# Patient Record
Sex: Female | Born: 1970 | Race: White | Hispanic: No | Smoking: Never smoker
Health system: Southern US, Community
[De-identification: ages and names within clinical notes are randomized; demographics above are authoritative.]

## PROBLEM LIST (undated history)

## (undated) DIAGNOSIS — K219 Gastro-esophageal reflux disease without esophagitis: Secondary | ICD-10-CM

## (undated) DIAGNOSIS — F329 Major depressive disorder, single episode, unspecified: Secondary | ICD-10-CM

## (undated) DIAGNOSIS — F419 Anxiety disorder, unspecified: Secondary | ICD-10-CM

## (undated) DIAGNOSIS — F32A Depression, unspecified: Secondary | ICD-10-CM

---

## 2000-02-17 ENCOUNTER — Other Ambulatory Visit: Admission: RE | Admit: 2000-02-17 | Discharge: 2000-02-17 | Payer: Self-pay | Admitting: *Deleted

## 2000-10-30 ENCOUNTER — Emergency Department (HOSPITAL_COMMUNITY): Admission: EM | Admit: 2000-10-30 | Discharge: 2000-10-30 | Payer: Self-pay | Admitting: Emergency Medicine

## 2001-05-12 ENCOUNTER — Emergency Department (HOSPITAL_COMMUNITY): Admission: EM | Admit: 2001-05-12 | Discharge: 2001-05-12 | Payer: Self-pay | Admitting: Emergency Medicine

## 2005-10-06 ENCOUNTER — Emergency Department (HOSPITAL_COMMUNITY): Admission: EM | Admit: 2005-10-06 | Discharge: 2005-10-06 | Payer: Self-pay | Admitting: Emergency Medicine
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
## 2012-08-05 ENCOUNTER — Ambulatory Visit: Payer: Managed Care, Other (non HMO) | Admitting: Physician Assistant

## 2012-08-05 VITALS — BP 119/72 | HR 107 | Temp 98.7°F | Resp 17 | Ht 60.5 in | Wt 194.2 lb

## 2012-08-05 DIAGNOSIS — H9209 Otalgia, unspecified ear: Secondary | ICD-10-CM

## 2012-08-05 DIAGNOSIS — H6120 Impacted cerumen, unspecified ear: Secondary | ICD-10-CM

## 2012-08-05 NOTE — Progress Notes (Signed)
Patient ID: Amanda Briggs MRN: [id number], DOB: [birth year], 41 y.o. Date of Encounter: 08/05/2012, 6:22 PM  Primary Physician: No primary provider on file.  Chief Complaint: Cerumen impaction  HPI: 40 y.o. year old female with history below presents with right ear cerumen impaction. Symptoms began three weeks prior. History of increased wax production requiring the ears to be flushed out several times previously. Uses Q-tips 1-2 times per month. Mild otalgia and tinnitus. No vertigo symptoms. Has gone through 6 ear wax candles. Notes some burnt orange drainage, discharge after using her candles. No bleeding from the ears. Has had some allergy symptoms. Otherwise doing well without issues today.    History reviewed. No pertinent past medical history.   Home Meds: Prior to Admission medications   Medication Sig Start Date End Date Taking? Authorizing Provider  Ascorbic Acid (VITAMIN C) 100 MG tablet Take 100 mg by mouth daily.   Yes Historical Provider, MD  cholecalciferol (VITAMIN D) 1000 UNITS tablet Take 1,000 Units by mouth daily.   Yes Historical Provider, MD  Lecithin 400 MG CAPS Take by mouth.   Yes Historical Provider, MD  milk thistle 175 MG tablet Take 175 mg by mouth daily.   Yes Historical Provider, MD  Multiple Vitamins-Minerals (MULTIVITAMIN WITH MINERALS) tablet Take 1 tablet by mouth daily.   Yes Historical Provider, MD  Specialty Vitamins Products (MAGNESIUM, AMINO ACID CHELATE,) 133 MG tablet Take 1 tablet by mouth 2 (two) times daily.   Yes Historical Provider, MD  thiamine (VITAMIN B-1) 100 MG tablet Take 100 mg by mouth daily.   Yes Historical Provider, MD    Allergies:  Allergies  Allergen Reactions  . Latex Other (See Comments)    She states her skin peels off   . Penicillins Other (See Comments)    Patient was told she was allergic when she was a kid     History   Social History  . Marital Status: Married    Spouse Name: N/A    Number of Children: N/A  .  Years of Education: N/A   Occupational History  . Not on file.   Social History Main Topics  . Smoking status: Never Smoker   . Smokeless tobacco: Not on file  . Alcohol Use: No  . Drug Use: No  . Sexually Active: Yes    Birth Control/ Protection: None   Other Topics Concern  . Not on file   Social History Narrative  . No narrative on file     Review of Systems: Constitutional: negative for chills, fever, night sweats, weight changes, or fatigue  HEENT: negative for vision changes, ST, epistaxis, or sinus pressure Cardiovascular: negative for chest pain or palpitations Respiratory: negative for hemoptysis, wheezing, shortness of breath, or cough Abdominal: negative for abdominal pain, nausea, or vomiting Dermatological: negative for rash Neurologic: negative for headache, dizziness, or syncope   Physical Exam: Blood pressure 119/72, pulse 107, temperature 98.7 F (37.1 C), temperature source Oral, resp. rate 17, height 5' 0.5" (1.537 m), weight 194 lb 3.2 oz (88.089 kg), last menstrual period 08/04/2012, SpO2 96.00%., Body mass index is 37.30 kg/(m^2). General: Well developed, well nourished, in no acute distress. Head: Normocephalic, atraumatic, eyes without discharge, sclera non-icteric, nares are without discharge. Right auditory canal with cerumen impaction. S/P ear lavage right auditory canal clear. Bilateral TM's are without perforation, pearly grey and translucent with reflective cone of light bilaterally. No mastoid TTP. Oral cavity moist, posterior pharynx without exudate, erythema, peritonsillar abscess, or post  nasal drip.  Neck: Supple. No thyromegaly. Full ROM. No lymphadenopathy. Lungs: Clear bilaterally to auscultation without wheezes, rales, or rhonchi. Breathing is unlabored. Heart: RRR with S1 S2. No murmurs, rubs, or gallops appreciated. Msk:  Strength and tone normal for age. Extremities/Skin: Warm and dry. No clubbing or cyanosis. No edema. No rashes or  suspicious lesions. Neuro: Alert and oriented X 3. Moves all extremities spontaneously. Gait is normal. CNII-XII grossly in tact. Psych:  Responds to questions appropriately with a normal affect.   Ear lavage performed by: Maurene Capes   ASSESSMENT AND PLAN:  41 y.o. year old female with cerumen impaction s/p ear lavage. -Resolved -Ear lavage per above -RTC prn  Signed, Eula Listen, PA-C 08/05/2012 6:22 PM

## 2012-11-18 ENCOUNTER — Ambulatory Visit: Payer: Managed Care, Other (non HMO) | Admitting: Physician Assistant

## 2012-11-18 VITALS — BP 122/64 | HR 90 | Temp 98.4°F | Resp 18 | Ht 61.0 in | Wt 183.0 lb

## 2012-11-18 DIAGNOSIS — R059 Cough, unspecified: Secondary | ICD-10-CM

## 2012-11-18 DIAGNOSIS — J209 Acute bronchitis, unspecified: Secondary | ICD-10-CM

## 2012-11-18 DIAGNOSIS — L282 Other prurigo: Secondary | ICD-10-CM

## 2012-11-18 MED ORDER — PREDNISONE 20 MG PO TABS: ORAL_TABLET | ORAL | Status: DC

## 2012-11-18 MED ORDER — HYDROCOD POLST-CHLORPHEN POLST 10-8 MG/5ML PO LQCR: 5.0000 mL | Freq: Two times a day (BID) | ORAL | Status: DC | PRN

## 2012-11-18 MED ORDER — AZITHROMYCIN 250 MG PO TABS: ORAL_TABLET | ORAL | Status: DC

## 2012-11-18 NOTE — Progress Notes (Signed)
  Subjective:    Patient ID: Amanda Briggs, female    DOB: 1971/06/14, 42 y.o.   MRN: [id number]  HPI 42 year old female presents with 2 problems:  #1: Prurtic rash x 1 week. Admits to diffuse, erythematous rash over arms and legs with itching on trunk. States symptoms have progressively worsened.  She does admit that she has started a new OTC supplement about 10 days ago.  Denies any new foods, lotions, soaps, or detergents.  Does live with her father and he is asymptomatic.  She has been taking Benadryl at bedtime which helps by "knocking her out."    #2: Sinus congestion, productive cough, and nasal drainage x 1 week. States cough has worsened and is her most problematic symptom.  She has been taking Delsym which has not been helping much.  Denies fever, chills, nausea, vomiting, headache, otalgia, or sore throat.  No SOB or wheezing.  She overall "feels great" after about a 72 lb weight loss.  Continues to exercise and has been eating healthy.      Review of Systems  Constitutional: Negative for fever and chills.  HENT: Positive for congestion, rhinorrhea and postnasal drip. Negative for ear pain, sore throat and neck pain.   Respiratory: Positive for cough. Negative for chest tightness, shortness of breath and wheezing.   Cardiovascular: Negative for chest pain.  Gastrointestinal: Negative for nausea, vomiting and abdominal pain.  Neurological: Negative for dizziness and headaches.       Objective:   Physical Exam  Constitutional: She is oriented to person, place, and time. She appears well-developed and well-nourished.  HENT:  Head: Normocephalic and atraumatic.  Right Ear: Hearing, tympanic membrane, external ear and ear canal normal.  Left Ear: Hearing, tympanic membrane, external ear and ear canal normal.  Mouth/Throat: Uvula is midline, oropharynx is clear and moist and mucous membranes are normal. No oropharyngeal exudate (clear postnasal drainage).  Eyes: Conjunctivae are  normal.  Neck: Normal range of motion.  Cardiovascular: Normal rate, regular rhythm and normal heart sounds.   Pulmonary/Chest: Effort normal and breath sounds normal.  Lymphadenopathy:    She has no cervical adenopathy.  Neurological: She is alert and oriented to person, place, and time.  Skin:  Diffuse erythematous rash with pinpoint scabbed areas. Excoriations from scratching. No evidence of secondary infection. Rash spares palms, soles, and interdigital webbed spaces.    Psychiatric: She has a normal mood and affect. Her behavior is normal. Judgment and thought content normal.          Assessment & Plan:  1. Cough - Plan: chlorpheniramine-HYDROcodone (TUSSIONEX PENNKINETIC ER) 10-8 MG/5ML LQCR  -Zpack as directed.   -Tussionex qhs prn cough  -Increase fluids and rest 2. Pruritic rash - Plan: predniSONE (DELTASONE) 20 MG tablet  -Likely allergic reaction to iodine supplements  -D/C supplements  -Prednisone taper  -Zyrtec 10 mg in the morning 3. Acute bronchitis - Plan: azithromycin (ZITHROMAX) 250 MG tablet  -See above.   -Follow up if symptoms worsen or fail to improve.

## 2012-11-18 NOTE — Patient Instructions (Signed)
Recommend Zyrtec 10 mg daily in the morning Tussionex at bedtime to help with cough  Prednisone in the morning

## 2012-12-01 ENCOUNTER — Telehealth: Payer: Self-pay

## 2012-12-01 NOTE — Telephone Encounter (Signed)
[facility] called to inquire about a refill on prednisone. Advised that chart is being reviewed.

## 2012-12-01 NOTE — Telephone Encounter (Signed)
Patient states rash has gotten worse on legs she took the prednisone she is advised to take zyrtec daily until we call her back

## 2012-12-01 NOTE — Telephone Encounter (Signed)
-  Likely allergic reaction to iodine supplements  -D/C supplements  -Prednisone taper  -Zyrtec 10 mg in the morning Need to know what she is currently doing for the rash

## 2012-12-01 NOTE — Telephone Encounter (Signed)
Patient would like referral to dermatologist or allergist for skin condition she saw Herbert Seta for on [id number] because she is still having problems.

## 2012-12-01 NOTE — Telephone Encounter (Signed)
I do not think it is appropriate to refill prednisone.  I have a placed a referral to dermatology.

## 2012-12-02 NOTE — Telephone Encounter (Signed)
Patient advised of referral and not to repeat prednisone. She will continue Zyrtec and take Benadryl at night.

## 2013-06-04 ENCOUNTER — Ambulatory Visit: Payer: Managed Care, Other (non HMO) | Admitting: Internal Medicine

## 2013-06-04 ENCOUNTER — Encounter: Payer: Self-pay | Admitting: Internal Medicine

## 2013-06-04 VITALS — BP 124/82 | HR 80 | Temp 99.0°F | Resp 16 | Ht 61.5 in | Wt 152.8 lb

## 2013-06-04 DIAGNOSIS — N390 Urinary tract infection, site not specified: Secondary | ICD-10-CM

## 2013-06-04 DIAGNOSIS — R350 Frequency of micturition: Secondary | ICD-10-CM

## 2013-06-04 LAB — POCT URINALYSIS DIPSTICK
Bilirubin, UA: NEGATIVE
Blood, UA: NEGATIVE
Nitrite, UA: POSITIVE
Protein, UA: NEGATIVE

## 2013-06-04 LAB — POCT UA - MICROSCOPIC ONLY: Crystals, Ur, HPF, POC: NEGATIVE

## 2013-06-04 MED ORDER — CIPROFLOXACIN HCL 500 MG PO TABS: 500.0000 mg | ORAL_TABLET | Freq: Two times a day (BID) | ORAL | Status: DC

## 2013-06-04 NOTE — Patient Instructions (Signed)
Urinary Tract Infection  Urinary tract infections (UTIs) can develop anywhere along your urinary tract. Your urinary tract is your body's drainage system for removing wastes and extra water. Your urinary tract includes two kidneys, two ureters, a bladder, and a urethra. Your kidneys are a pair of bean-shaped organs. Each kidney is about the size of your fist. They are located below your ribs, one on each side of your spine.  CAUSES  Infections are caused by microbes, which are microscopic organisms, including fungi, viruses, and bacteria. These organisms are so small that they can only be seen through a microscope. Bacteria are the microbes that most commonly cause UTIs.  SYMPTOMS   Symptoms of UTIs may vary by age and gender of the patient and by the location of the infection. Symptoms in young women typically include a frequent and intense urge to urinate and a painful, burning feeling in the bladder or urethra during urination. Older women and men are more likely to be tired, shaky, and weak and have muscle aches and abdominal pain. A fever may mean the infection is in your kidneys. Other symptoms of a kidney infection include pain in your back or sides below the ribs, nausea, and vomiting.  DIAGNOSIS  To diagnose a UTI, your caregiver will ask you about your symptoms. Your caregiver also will ask to provide a urine sample. The urine sample will be tested for bacteria and white blood cells. White blood cells are made by your body to help fight infection.  TREATMENT   Typically, UTIs can be treated with medication. Because most UTIs are caused by a bacterial infection, they usually can be treated with the use of antibiotics. The choice of antibiotic and length of treatment depend on your symptoms and the type of bacteria causing your infection.  HOME CARE INSTRUCTIONS   If you were prescribed antibiotics, take them exactly as your caregiver instructs you. Finish the medication even if you feel better after you  have only taken some of the medication.   Drink enough water and fluids to keep your urine clear or pale yellow.   Avoid caffeine, tea, and carbonated beverages. They tend to irritate your bladder.   Empty your bladder often. Avoid holding urine for long periods of time.   Empty your bladder before and after sexual intercourse.   After a bowel movement, women should cleanse from front to back. Use each tissue only once.  SEEK MEDICAL CARE IF:    You have back pain.   You develop a fever.   Your symptoms do not begin to resolve within 3 days.  SEEK IMMEDIATE MEDICAL CARE IF:    You have severe back pain or lower abdominal pain.   You develop chills.   You have nausea or vomiting.   You have continued burning or discomfort with urination.  MAKE SURE YOU:    Understand these instructions.   Will watch your condition.   Will get help right away if you are not doing well or get worse.  Document Released: 06/25/2005 Document Revised: 03/16/2012 Document Reviewed: 10/24/2011  [employer] Patient Information 2014 [employer].

## 2013-06-04 NOTE — Progress Notes (Signed)
  Subjective:    Patient ID: Amanda Briggs, female    DOB: [birth year], 42 y.o.   MRN: [id number]  HPI Has 3d of frequency, urgency and hard to finish. No fever , chills or back pain   Review of Systems     Objective:   Physical Exam Normal, no tenderness       Assessment & Plan:  UTI/Cipro

## 2013-06-06 ENCOUNTER — Telehealth: Payer: Self-pay

## 2013-06-06 ENCOUNTER — Other Ambulatory Visit (INDEPENDENT_AMBULATORY_CARE_PROVIDER_SITE_OTHER): Payer: Managed Care, Other (non HMO) | Admitting: *Deleted

## 2013-06-06 DIAGNOSIS — N390 Urinary tract infection, site not specified: Secondary | ICD-10-CM

## 2013-06-06 NOTE — Progress Notes (Signed)
Here for labs only

## 2013-06-06 NOTE — Telephone Encounter (Signed)
Pt states she was seen over the weekend for uti symptoms and is concerned about something during her ov.   States she told triage she also wanted to be tested for STD's and provided them with urine sample divided over two cups.   States she and the dr never spoke about the std testing because she assumed the triage person notified him. States no one took any blood from her.   Pt is concerned no std testing was performed as requested and would like to know so she can rtc and provide blood if necessary.   Would like to come today after work if she can.   Please call pt: [phone] -- can leave detailed message and pt will return call after work.  bf

## 2013-06-06 NOTE — Telephone Encounter (Signed)
Put in order called patient.

## 2013-06-06 NOTE — Telephone Encounter (Signed)
She can come back in for labwork but should make sure that in the future she tells the provider as well. It is routine for [place] to collect 2 cups but that does not mean we automatically test for STD's.  She can come in and leave another dirty catch to send for GC/CL and have blood drawn for HIV and RPR.

## 2013-06-07 LAB — RPR

## 2013-06-07 LAB — HIV ANTIBODY (ROUTINE TESTING W REFLEX): HIV: NONREACTIVE

## 2013-06-08 LAB — GC/CHLAMYDIA PROBE AMP
CT Probe RNA: NEGATIVE
GC Probe RNA: NEGATIVE

## 2014-12-13 NOTE — H&P (Signed)
  Patient name Amanda Briggs, Amanda Briggs DICTATION# [id number] CSN# [id number]  Juluis MireMCCOMB,Amanda Reierson S, MD 12/13/2014 1:19 PM

## 2014-12-14 ENCOUNTER — Encounter (HOSPITAL_COMMUNITY): Payer: Self-pay | Admitting: *Deleted

## 2014-12-14 MED ORDER — CLINDAMYCIN PHOSPHATE 900 MG/50ML IV SOLN
900.0000 mg | INTRAVENOUS | Status: AC
  Administered 2014-12-15: 900 mg via INTRAVENOUS
  Filled 2014-12-14: qty 50

## 2014-12-14 MED ORDER — CIPROFLOXACIN IN D5W 400 MG/200ML IV SOLN
400.0000 mg | INTRAVENOUS | Status: AC
  Administered 2014-12-15: 400 mg via INTRAVENOUS
  Filled 2014-12-14: qty 200

## 2014-12-15 ENCOUNTER — Ambulatory Visit (HOSPITAL_COMMUNITY)
Admission: RE | Admit: 2014-12-15 | Discharge: 2014-12-15 | Disposition: A | Discharge status: Home / Self Care | Payer: Managed Care, Other (non HMO) | Source: Ambulatory Visit | Attending: Obstetrics and Gynecology | Admitting: Obstetrics and Gynecology

## 2014-12-15 ENCOUNTER — Encounter (HOSPITAL_COMMUNITY)
Admission: RE | Disposition: A | Discharge status: Home / Self Care | Payer: Self-pay | Source: Ambulatory Visit | Attending: Obstetrics and Gynecology

## 2014-12-15 ENCOUNTER — Ambulatory Visit (HOSPITAL_COMMUNITY): Payer: Managed Care, Other (non HMO) | Admitting: Anesthesiology

## 2014-12-15 ENCOUNTER — Encounter (HOSPITAL_COMMUNITY): Payer: Self-pay | Admitting: *Deleted

## 2014-12-15 DIAGNOSIS — K219 Gastro-esophageal reflux disease without esophagitis: Secondary | ICD-10-CM | POA: Insufficient documentation

## 2014-12-15 DIAGNOSIS — F419 Anxiety disorder, unspecified: Secondary | ICD-10-CM | POA: Diagnosis not present

## 2014-12-15 DIAGNOSIS — O034 Incomplete spontaneous abortion without complication: Secondary | ICD-10-CM | POA: Diagnosis present

## 2014-12-15 DIAGNOSIS — F329 Major depressive disorder, single episode, unspecified: Secondary | ICD-10-CM | POA: Diagnosis not present

## 2014-12-15 HISTORY — DX: Gastro-esophageal reflux disease without esophagitis: K21.9

## 2014-12-15 HISTORY — DX: Depression, unspecified: F32.A

## 2014-12-15 HISTORY — DX: Major depressive disorder, single episode, unspecified: F32.9

## 2014-12-15 HISTORY — DX: Anxiety disorder, unspecified: F41.9

## 2014-12-15 HISTORY — PX: DILATION AND EVACUATION: SHX1459

## 2014-12-15 LAB — CBC
HEMATOCRIT: 40.2 % (ref 36.0–46.0)
Hemoglobin: 13 g/dL (ref 12.0–15.0)
MCH: 26.1 pg (ref 26.0–34.0)
MCHC: 32.3 g/dL (ref 30.0–36.0)
MCV: 80.7 fL (ref 78.0–100.0)
Platelets: 238 10*3/uL (ref 150–400)
RBC: 4.98 MIL/uL (ref 3.87–5.11)
RDW: 16.2 % — ABNORMAL HIGH (ref 11.5–15.5)
WBC: 10.6 10*3/uL — ABNORMAL HIGH (ref 4.0–10.5)

## 2014-12-15 LAB — ABO/RH: ABO/RH(D): O POS

## 2014-12-15 SURGERY — DILATION AND EVACUATION, UTERUS
Anesthesia: Monitor Anesthesia Care | Site: Uterus

## 2014-12-15 MED ORDER — PROPOFOL 10 MG/ML IV BOLUS
INTRAVENOUS | Status: AC
  Filled 2014-12-15: qty 20

## 2014-12-15 MED ORDER — CHLOROPROCAINE HCL 1 % IJ SOLN
INTRAMUSCULAR | Status: AC
  Filled 2014-12-15: qty 30

## 2014-12-15 MED ORDER — SCOPOLAMINE 1 MG/3DAYS TD PT72
1.0000 | MEDICATED_PATCH | Freq: Once | TRANSDERMAL | Status: DC
  Administered 2014-12-15: 1.5 mg via TRANSDERMAL

## 2014-12-15 MED ORDER — SCOPOLAMINE 1 MG/3DAYS TD PT72
MEDICATED_PATCH | TRANSDERMAL | Status: AC
  Administered 2014-12-15: 1.5 mg via TRANSDERMAL
  Filled 2014-12-15: qty 1

## 2014-12-15 MED ORDER — PROPOFOL 10 MG/ML IV EMUL
INTRAVENOUS | Status: DC | PRN
  Administered 2014-12-15: 40 mg via INTRAVENOUS

## 2014-12-15 MED ORDER — DEXAMETHASONE SODIUM PHOSPHATE 4 MG/ML IJ SOLN
INTRAMUSCULAR | Status: AC
  Filled 2014-12-15: qty 1

## 2014-12-15 MED ORDER — ONDANSETRON HCL 4 MG/2ML IJ SOLN
INTRAMUSCULAR | Status: DC | PRN
  Administered 2014-12-15: 4 mg via INTRAVENOUS

## 2014-12-15 MED ORDER — LIDOCAINE HCL (CARDIAC) 20 MG/ML IV SOLN
INTRAVENOUS | Status: AC
  Filled 2014-12-15: qty 5

## 2014-12-15 MED ORDER — LACTATED RINGERS IV SOLN
INTRAVENOUS | Status: DC
  Administered 2014-12-15 (×2): via INTRAVENOUS

## 2014-12-15 MED ORDER — PROPOFOL INFUSION 10 MG/ML OPTIME
INTRAVENOUS | Status: DC | PRN
  Administered 2014-12-15: 75 ug/kg/min via INTRAVENOUS

## 2014-12-15 MED ORDER — FENTANYL CITRATE 0.05 MG/ML IJ SOLN
INTRAMUSCULAR | Status: DC | PRN
  Administered 2014-12-15: 100 ug via INTRAVENOUS

## 2014-12-15 MED ORDER — MIDAZOLAM HCL 2 MG/2ML IJ SOLN
INTRAMUSCULAR | Status: AC
  Filled 2014-12-15: qty 2

## 2014-12-15 MED ORDER — FENTANYL CITRATE 0.05 MG/ML IJ SOLN: 25.0000 ug | INTRAMUSCULAR | Status: DC | PRN

## 2014-12-15 MED ORDER — OXYCODONE-ACETAMINOPHEN 7.5-325 MG PO TABS: 1.0000 | ORAL_TABLET | ORAL | Status: AC | PRN

## 2014-12-15 MED ORDER — ONDANSETRON HCL 4 MG/2ML IJ SOLN
INTRAMUSCULAR | Status: AC
  Filled 2014-12-15: qty 2

## 2014-12-15 MED ORDER — FENTANYL CITRATE 0.05 MG/ML IJ SOLN
INTRAMUSCULAR | Status: AC
  Filled 2014-12-15: qty 5

## 2014-12-15 MED ORDER — DEXAMETHASONE SODIUM PHOSPHATE 10 MG/ML IJ SOLN
INTRAMUSCULAR | Status: DC | PRN
  Administered 2014-12-15: 4 mg via INTRAVENOUS

## 2014-12-15 MED ORDER — KETOROLAC TROMETHAMINE 30 MG/ML IJ SOLN
INTRAMUSCULAR | Status: AC
  Filled 2014-12-15: qty 1

## 2014-12-15 MED ORDER — ARTIFICIAL TEARS OP OINT
TOPICAL_OINTMENT | OPHTHALMIC | Status: AC
  Filled 2014-12-15: qty 3.5

## 2014-12-15 MED ORDER — KETOROLAC TROMETHAMINE 30 MG/ML IJ SOLN
INTRAMUSCULAR | Status: DC | PRN
  Administered 2014-12-15: 30 mg via INTRAVENOUS

## 2014-12-15 MED ORDER — 0.9 % SODIUM CHLORIDE (POUR BTL) OPTIME
TOPICAL | Status: DC | PRN
  Administered 2014-12-15: 1000 mL

## 2014-12-15 MED ORDER — CHLOROPROCAINE HCL 1 % IJ SOLN
INTRAMUSCULAR | Status: DC | PRN
Start: 2014-12-15 — End: 2014-12-15
  Administered 2014-12-15: 20 mL

## 2014-12-15 MED ORDER — MIDAZOLAM HCL 2 MG/2ML IJ SOLN
INTRAMUSCULAR | Status: DC | PRN
  Administered 2014-12-15: 2 mg via INTRAVENOUS

## 2014-12-15 MED ORDER — LIDOCAINE HCL (CARDIAC) 20 MG/ML IV SOLN
INTRAVENOUS | Status: DC | PRN
  Administered 2014-12-15: 80 mg via INTRAVENOUS

## 2014-12-15 MED ORDER — FENTANYL CITRATE 0.05 MG/ML IJ SOLN
INTRAMUSCULAR | Status: AC
  Filled 2014-12-15: qty 2

## 2014-12-15 SURGICAL SUPPLY — 22 items
CLOTH BEACON ORANGE TIMEOUT ST (SAFETY) ×3 IMPLANT
DECANTER SPIKE VIAL GLASS SM (MISCELLANEOUS) ×3 IMPLANT
GLOVE BIO SURGEON STRL SZ7 (GLOVE) ×1 IMPLANT
GLOVE BIOGEL PI IND STRL 6.5 (GLOVE) IMPLANT
GLOVE BIOGEL PI IND STRL 7.0 (GLOVE) IMPLANT
GLOVE BIOGEL PI INDICATOR 6.5 (GLOVE) ×2
GLOVE BIOGEL PI INDICATOR 7.0 (GLOVE) ×4
GLOVE SURG SS PI 7.0 STRL IVOR (GLOVE) ×2 IMPLANT
GOWN STRL REUS W/TWL LRG LVL3 (GOWN DISPOSABLE) ×8 IMPLANT
KIT BERKELEY 1ST TRIMESTER 3/8 (MISCELLANEOUS) ×3 IMPLANT
NDL SPNL 20GX3.5 QUINCKE YW (NEEDLE) ×1 IMPLANT
NEEDLE SPNL 20GX3.5 QUINCKE YW (NEEDLE) ×3 IMPLANT
NS IRRIG 1000ML POUR BTL (IV SOLUTION) ×3 IMPLANT
PACK VAGINAL MINOR WOMEN LF (CUSTOM PROCEDURE TRAY) ×3 IMPLANT
PAD OB MATERNITY 4.3X12.25 (PERSONAL CARE ITEMS) ×3 IMPLANT
PAD PREP 24X48 CUFFED NSTRL (MISCELLANEOUS) ×3 IMPLANT
SET BERKELEY SUCTION TUBING (SUCTIONS) ×3 IMPLANT
TOWEL OR 17X24 6PK STRL BLUE (TOWEL DISPOSABLE) ×6 IMPLANT
VACURETTE 10 RIGID CVD (CANNULA) IMPLANT
VACURETTE 7MM CVD STRL WRAP (CANNULA) IMPLANT
VACURETTE 8 RIGID CVD (CANNULA) ×2 IMPLANT
VACURETTE 9 RIGID CVD (CANNULA) IMPLANT

## 2014-12-15 NOTE — Anesthesia Preprocedure Evaluation (Signed)
Anesthesia Evaluation  Patient identified by MRN, date of birth, ID band Patient awake    Reviewed: Allergy & Precautions, H&P , Patient's Chart, lab work & pertinent test results, reviewed documented beta blocker date and time   Airway Mallampati: II  TM Distance: >3 FB Neck ROM: full    Dental no notable dental hx.    Pulmonary  breath sounds clear to auscultation  Pulmonary exam normal       Cardiovascular Rhythm:regular Rate:Normal     Neuro/Psych    GI/Hepatic   Endo/Other    Renal/GU      Musculoskeletal   Abdominal   Peds  Hematology   Anesthesia Other Findings   Reproductive/Obstetrics                             Anesthesia Physical Anesthesia Plan  ASA: II  Anesthesia Plan: MAC   Post-op Pain Management:    Induction: Intravenous  Airway Management Planned: Mask and Natural Airway  Additional Equipment:   Intra-op Plan:   Post-operative Plan:   Informed Consent: I have reviewed the patients History and Physical, chart, labs and discussed the procedure including the risks, benefits and alternatives for the proposed anesthesia with the patient or authorized representative who has indicated his/her understanding and acceptance.   Dental Advisory Given  Plan Discussed with: CRNA and Surgeon  Anesthesia Plan Comments: (Discussed sedation and potential to need to place airway or ETT if warranted by clinical changes intra-operatively. We will start procedure as MAC.)        Anesthesia Quick Evaluation  

## 2014-12-15 NOTE — Anesthesia Postprocedure Evaluation (Signed)
  Anesthesia Post-op Note  Patient: Amanda Briggs  Procedure(s) Performed: Procedure(s) (LRB): DILATATION AND EVACUATION (N/A)  Patient Location: PACU  Anesthesia Type: General  Level of Consciousness: awake and alert   Airway and Oxygen Therapy: Patient Spontanous Breathing  Post-op Pain: mild  Post-op Assessment: Post-op Vital signs reviewed, Patient's Cardiovascular Status Stable, Respiratory Function Stable, Patent Airway and No signs of Nausea or vomiting  Last Vitals:  Filed Vitals:   12/15/14 1130  BP: 106/45  Pulse: 78  Temp:   Resp: 24    Post-op Vital Signs: stable   Complications: No apparent anesthesia complications

## 2014-12-15 NOTE — Op Note (Signed)
Amanda Briggs:  [name], Amanda Briggs                 ACCOUNT NO.:  [id number]  MEDICAL RECORD NO.:  [id number]  LOCATION:  [facility]                          FACILITY:  [facility]  PHYSICIAN:  Juluis MireJohn S. Trevonte Ashkar, M.D.   DATE OF BIRTH:  1971/04/28  DATE OF PROCEDURE:  12/15/2014 DATE OF DISCHARGE:  12/15/2014                              OPERATIVE REPORT   PREOPERATIVE DIAGNOSIS:  Nonviable first trimester pregnancy.  POSTOPERATIVE DIAGNOSIS:  Nonviable first trimester pregnancy.  PROCEDURE:  Paracervical block.  Dilation and evacuation.  SURGEON:  Juluis MireJohn S. Mashawn Brazil, M.D.  ANESTHESIA:  Sedation and paracervical block.  BLOOD LOSS:  Approximately 150 mL.  PACKS:  None.  DRAINS:  None.  INTRAOPERATIVE BLOOD PLACEMENT:  None.  COMPLICATIONS:  None.  INDICATIONS:  Dictated in history and physical.  DESCRIPTION OF PROCEDURE:  The patient was taken to OR, placed in supine position.  After sedation, she was placed in dorsal lithotomy position using the Allen stirrups.  The patient was draped in sterile field.  A speculum was placed in vaginal vault.  The vagina and cervix were cleansed out with Betadine.  The anterior lip of the cervix was anesthetized with 1% Xylocaine  and secured with a single-tooth tenaculum.  Paracervical blocks instituted using approximately 20 mL of 1% Nesacaine.  Uterus sounded to approximately 10 cm.  Cervix was dilated to a size 27 Pratt dilator.  A size 8 curved suction curette was introduced.  Intrauterine cavity was emptied using suction curetting. This was continued till no additional tissue was obtained.  We sharply curetted next and felt some smooth this the anterior wall. Repeat suction curetting did obtain some additional tissue.  No additional tissue was obtained with repeat suction curetting.  The uterus contracting down well.  Repeat sharp curetting revealed all quadrants to be clear by gritty field.  No signs of perforation or active bleeding. Single-tooth tenaculum then  removed.  The speculum was then removed. The patient was taken out of dorsal lithotomy position.  Once alert, transferred to recovery room in good condition.  Sponge, instrument, and needle count was reported correct by circulating nurse.     Juluis MireJohn S. Sanya Kobrin, M.D.     JSM/MEDQ  D:  12/15/2014  T:  12/15/2014  Job:  [id number]

## 2014-12-15 NOTE — H&P (Addendum)
  44-year-old primigravida female comes in for dilation and evacuation. Indications are nonviable pregnancy.   Allergies latex and penicillin Indications are none. For past medical history family history and social history please see dictated history and physical. Use systems is noncontributory.  Physical exam. Patient's afebrile stable vital signs. Lungs are clear. Cardiovascular system exam regular rhythm and rate without murmurs or gallops. Her abdominal exam is benign. Pelvic uterus approximately 9 weeks in size. Strandy trace edema. Neurological exam is grossly within normal limits  Impression: Nonviable first trimester pregnancy. Plan: The patient undergo dilatation and evacuation. The risk of been discussed as described. In her dictated H&P

## 2014-12-15 NOTE — Discharge Instructions (Signed)

## 2014-12-15 NOTE — H&P (Signed)
  History and physical exam unchanged 

## 2014-12-15 NOTE — Brief Op Note (Signed)
12/15/2014  10:55 AM  PATIENT:  Lynett GrimesAmy Portell  44 y.o. female  PRE-OPERATIVE DIAGNOSIS:  NON VIABLE, 1ST TRIMESTER  POST-OPERATIVE DIAGNOSIS:  NON VIABLE, 1ST TRIMESTER  PROCEDURE:  Procedure(s): DILATATION AND EVACUATION (N/A)  SURGEON:  Surgeon(s) and Role:    * Richardean ChimeraJohn Jair Lindblad, MD - Primary  PHYSICIAN ASSISTANT:   ASSISTANTS: none   ANESTHESIA:   IV sedation and paracervical block  EBL:  Total I/O In: 1000 [I.V.:1000] Out: 50 [Blood:50]  BLOOD ADMINISTERED:none  DRAINS: none   LOCAL MEDICATIONS USED:  OTHER nesicaine  SPECIMEN:  Source of Specimen:  products of conception  DISPOSITION OF SPECIMEN:  PATHOLOGY  COUNTS:  YES  TOURNIQUET:  * No tourniquets in log *  DICTATION: .Other Dictation: Dictation Number [id number]  PLAN OF CARE: Discharge to home after PACU  PATIENT DISPOSITION:  PACU - hemodynamically stable.   Delay start of Pharmacological VTE agent (>24hrs) due to surgical blood loss or risk of bleeding: not applicable

## 2014-12-15 NOTE — Transfer of Care (Signed)
Immediate Anesthesia Transfer of Care Note  Patient: Amanda Briggs  Procedure(s) Performed: Procedure(s): DILATATION AND EVACUATION (N/A)  Patient Location: PACU  Anesthesia Type:MAC  Level of Consciousness: awake, alert  and oriented  Airway & Oxygen Therapy: Patient Spontanous Breathing and Patient connected to nasal cannula oxygen  Post-op Assessment: Report given to RN and Post -op Vital signs reviewed and stable  Post vital signs: Reviewed and stable  Last Vitals:  Filed Vitals:   12/15/14 1101  BP: 120/48  Pulse: 91  Temp: 36.8 C  Resp: 16    Complications: No apparent anesthesia complications

## 2014-12-18 ENCOUNTER — Encounter (HOSPITAL_COMMUNITY): Payer: Self-pay | Admitting: Obstetrics and Gynecology

## 2015-01-11 ENCOUNTER — Other Ambulatory Visit: Payer: Self-pay | Admitting: Obstetrics and Gynecology

## 2015-01-12 LAB — CYTOLOGY - PAP
